# Patient Record
Sex: Male | Born: 2000 | Race: Black or African American | Hispanic: No | Marital: Single | Smoking: Never smoker
Health system: Southern US, Community
[De-identification: ages and names within clinical notes are randomized; demographics above are authoritative.]

---
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
## 2020-09-14 ENCOUNTER — Other Ambulatory Visit: Payer: Self-pay

## 2020-09-14 ENCOUNTER — Encounter (HOSPITAL_COMMUNITY): Payer: Self-pay

## 2020-09-14 ENCOUNTER — Ambulatory Visit (INDEPENDENT_AMBULATORY_CARE_PROVIDER_SITE_OTHER): Payer: Medicaid Other

## 2020-09-14 ENCOUNTER — Ambulatory Visit (HOSPITAL_COMMUNITY)
Admission: EM | Admit: 2020-09-14 | Discharge: 2020-09-14 | Disposition: A | Discharge status: Home / Self Care | Payer: Medicaid Other | Attending: Medical Oncology | Admitting: Medical Oncology

## 2020-09-14 DIAGNOSIS — S6991XA Unspecified injury of right wrist, hand and finger(s), initial encounter: Secondary | ICD-10-CM

## 2020-09-14 DIAGNOSIS — M79644 Pain in right finger(s): Secondary | ICD-10-CM | POA: Diagnosis not present

## 2020-09-14 MED ORDER — KETOROLAC TROMETHAMINE 30 MG/ML IJ SOLN
30.0000 mg | Freq: Once | INTRAMUSCULAR | Status: AC
  Administered 2020-09-14: 30 mg via INTRAMUSCULAR

## 2020-09-14 MED ORDER — KETOROLAC TROMETHAMINE 30 MG/ML IJ SOLN
INTRAMUSCULAR | Status: AC
  Filled 2020-09-14: qty 1

## 2020-09-14 MED ORDER — IBUPROFEN 800 MG PO TABS: 800.0000 mg | ORAL_TABLET | Freq: Three times a day (TID) | ORAL | 0 refills | Status: AC

## 2020-09-14 NOTE — ED Provider Notes (Signed)
[facility]    CSN: [id number] Arrival date & time: 09/14/20  1155      History   Chief Complaint Chief Complaint  Patient presents with  . Hand Pain    HPI Jorge Myers is a 20 y.o. male.   HPI   Hand Pain: Pt reports that yesterday he hit his hand against a wall. Immediately had thumb and right middle finger pain. Pain rated 8-9/10. He has not taken anything for pain today. He does reports some mild swelling of the thumb and finger along with mild numbness and tingling. No skin breakdown, erythema, loss of sensation or skin color change of fingers. No previous injury to this area.    History reviewed. No pertinent past medical history.  There are no problems to display for this patient.   History reviewed. No pertinent surgical history.     Home Medications    Prior to Admission medications   Medication Sig Start Date End Date Taking? Authorizing Provider  ibuprofen (ADVIL) 800 MG tablet Take 1 tablet (800 mg total) by mouth 3 (three) times daily. 09/15/20  Yes Rushie Chestnut, PA-C    Family History History reviewed. No pertinent family history.  Social History Social History   Tobacco Use  . Smoking status: Passive Smoke Exposure - Never Smoker  . Smokeless tobacco: Never Used  Substance Use Topics  . Alcohol use: No  . Drug use: No     Allergies   Patient has no known allergies.   Review of Systems Review of Systems  As stated above in HPI Physical Exam Triage Vital Signs ED Triage Vitals  Enc Vitals Group     BP 09/14/20 1213 131/71     Pulse Rate 09/14/20 1213 88     Resp 09/14/20 1213 16     Temp 09/14/20 1213 98.8 F (37.1 C)     Temp Source 09/14/20 1213 Oral     SpO2 09/14/20 1213 97 %     Weight --      Height --      Head Circumference --      Peak Flow --      Pain Score 09/14/20 1211 5     Pain Loc --      Pain Edu? --      Excl. in GC? --    No data found.  Updated Vital Signs BP 131/71 (BP Location:  Right Arm)   Pulse 88   Temp 98.8 F (37.1 C) (Oral)   Resp 16   SpO2 97%    Physical Exam Vitals and nursing note reviewed.  Constitutional:      General: He is not in acute distress.    Appearance: Normal appearance. He is not ill-appearing, toxic-appearing or diaphoretic.  Musculoskeletal:     Right forearm: Normal.     Left forearm: Normal.     Right wrist: Normal.     Left wrist: Normal.     Left hand: Normal.       Arms:  Neurological:     Mental Status: He is alert.      UC Treatments / Results  Labs (all labs ordered are listed, but only abnormal results are displayed) Labs Reviewed - No data to display  EKG   Radiology DG Hand Complete Right  Result Date: 09/14/2020 CLINICAL DATA:  Injury with pain in the thumb and long finger. Trauma yesterday. EXAM: RIGHT HAND - COMPLETE 3+ VIEW COMPARISON:  None. FINDINGS: There is  no evidence of fracture or dislocation. There is no evidence of arthropathy or other focal bone abnormality. Soft tissues are unremarkable. IMPRESSION: Normal Electronically Signed   By: Paulina Fusi M.D.   On: 09/14/2020 14:00    Procedures Procedures (including critical care time)  Medications Ordered in UC Medications  ketorolac (TORADOL) 30 MG/ML injection 30 mg (has no administration in time range)    Initial Impression / Assessment and Plan / UC Course  I have reviewed the triage vital signs and the nursing notes.  Pertinent labs & imaging results that were available during my care of the patient were reviewed by me and considered in my medical decision making (see chart for details).     New. X ray pending.   Update: X ray is normal.  Discussed with patient that he likely sprained these areas.  I do not want to brace these areas as I do not want the joints to stiffen up.  I have advised that he avoid any heavy lifting or twisting items such as opening a can of anything.  He has a driver today.  He has tolerated ketorolac well in  the past.  He will use Tylenol as needed and directed on the bottle for the rest of the day and then will use Aleve or ibuprofen as needed tomorrow.  Final Clinical Impressions(s) / UC Diagnoses   Final diagnoses:  Pain of right thumb  Finger pain, right   Discharge Instructions   None    ED Prescriptions    Medication Sig Dispense Auth. Provider   ibuprofen (ADVIL) 800 MG tablet Take 1 tablet (800 mg total) by mouth 3 (three) times daily. 21 tablet Rushie Chestnut, [place]     PDMP not reviewed this encounter.   Rushie Chestnut, [place] 09/14/20 1411

## 2020-09-14 NOTE — ED Triage Notes (Signed)
Pt presents with numbness and swelling in the right thumb x 1 day. States he hit a wall with th right hand.

## 2020-12-15 ENCOUNTER — Ambulatory Visit (HOSPITAL_COMMUNITY)
Admission: EM | Admit: 2020-12-15 | Discharge: 2020-12-15 | Disposition: A | Discharge status: Home / Self Care | Payer: Medicaid Other

## 2020-12-15 ENCOUNTER — Other Ambulatory Visit: Payer: Self-pay

## 2020-12-15 NOTE — ED Notes (Signed)
No answer

## 2020-12-15 NOTE — ED Triage Notes (Signed)
Pt called in front lobby and greeter called Pt on phone no answer

## 2021-05-18 ENCOUNTER — Ambulatory Visit (HOSPITAL_COMMUNITY)
Admission: EM | Admit: 2021-05-18 | Discharge: 2021-05-18 | Disposition: A | Discharge status: Home / Self Care | Payer: Medicaid Other

## 2021-05-18 ENCOUNTER — Encounter (HOSPITAL_COMMUNITY): Payer: Self-pay

## 2021-05-18 ENCOUNTER — Other Ambulatory Visit: Payer: Self-pay

## 2021-05-18 DIAGNOSIS — J029 Acute pharyngitis, unspecified: Secondary | ICD-10-CM | POA: Diagnosis not present

## 2021-05-18 NOTE — ED Provider Notes (Signed)
[facility]    CSN: [id number] Arrival date & time: 05/18/21  1018      History   Chief Complaint Chief Complaint  Patient presents with   Sore Throat         HPI Jorge Myers is a 20 y.o. male.   Patient presents today with a 2-day history of sore throat.  Reports that sore throat has improved but he continues to have some mild symptoms.  He denies any additional symptoms including cough, nasal congestion, shortness of breath, chest pain, nausea, vomiting.  Denies any known sick contacts.  He was exposed to more allergens that is with employment and wonders if this could have been contributing to symptoms.  He denies formal diagnosis of seasonal allergies does not take medication for this on a regular basis.  He has tried Tylenol and over-the-counter medications with improvement of symptoms.  Denies any difficulty speaking, difficulty swallowing, shortness of breath.  He did miss work for 2 days as result of symptoms and is requesting a work excuse note.   History reviewed. No pertinent past medical history.  There are no problems to display for this patient.   History reviewed. No pertinent surgical history.     Home Medications    Prior to Admission medications   Medication Sig Start Date End Date Taking? Authorizing Provider  ibuprofen (ADVIL) 800 MG tablet Take 1 tablet (800 mg total) by mouth 3 (three) times daily. 09/15/20   Rushie Chestnut, PA-C    Family History History reviewed. No pertinent family history.  Social History Social History   Tobacco Use   Smoking status: Never    Passive exposure: Yes   Smokeless tobacco: Never  Substance Use Topics   Alcohol use: Never   Drug use: Never     Allergies   Patient has no known allergies.   Review of Systems Review of Systems  Constitutional:  Negative for activity change, appetite change, fatigue and fever.  HENT:  Positive for sore throat. Negative for congestion, sinus pressure,  sneezing, trouble swallowing and voice change.   Respiratory:  Negative for cough and shortness of breath.   Cardiovascular:  Negative for chest pain.  Gastrointestinal:  Negative for abdominal pain, diarrhea, nausea and vomiting.  Musculoskeletal:  Negative for arthralgias and myalgias.  Neurological:  Negative for dizziness, light-headedness and headaches.    Physical Exam Triage Vital Signs ED Triage Vitals  Enc Vitals Group     BP 05/18/21 1135 124/72     Pulse Rate 05/18/21 1135 60     Resp 05/18/21 1135 16     Temp 05/18/21 1135 98.8 F (37.1 C)     Temp Source 05/18/21 1135 Oral     SpO2 05/18/21 1135 100 %     Weight --      Height --      Head Circumference --      Peak Flow --      Pain Score 05/18/21 1132 1     Pain Loc --      Pain Edu? --      Excl. in GC? --    No data found.  Updated Vital Signs BP 124/72 (BP Location: Right Arm)   Pulse 60   Temp 98.8 F (37.1 C) (Oral)   Resp 16   SpO2 100%   Visual Acuity Right Eye Distance:   Left Eye Distance:   Bilateral Distance:    Right Eye Near:   Left Eye  Near:    Bilateral Near:     Physical Exam Vitals reviewed.  Constitutional:      General: He is awake.     Appearance: Normal appearance. He is well-developed. He is not ill-appearing.     Comments: Very pleasant male appears stated age no acute distress sitting comfortably in exam room  HENT:     Head: Normocephalic and atraumatic.     Right Ear: Tympanic membrane, ear canal and external ear normal. Tympanic membrane is not erythematous or bulging.     Left Ear: Tympanic membrane, ear canal and external ear normal. Tympanic membrane is not erythematous or bulging.     Nose: Nose normal.     Mouth/Throat:     Pharynx: Uvula midline. No oropharyngeal exudate or posterior oropharyngeal erythema.     Comments: Mild drainage in posterior oropharynx Cardiovascular:     Rate and Rhythm: Normal rate and regular rhythm.     Heart sounds: Normal heart  sounds, S1 normal and S2 normal. No murmur heard. Pulmonary:     Effort: Pulmonary effort is normal. No accessory muscle usage or respiratory distress.     Breath sounds: Normal breath sounds. No stridor. No wheezing, rhonchi or rales.     Comments: Clear to auscultation bilaterally Neurological:     Mental Status: He is alert.  Psychiatric:        Behavior: Behavior is cooperative.     UC Treatments / Results  Labs (all labs ordered are listed, but only abnormal results are displayed) Labs Reviewed - No data to display  EKG   Radiology No results found.  Procedures Procedures (including critical care time)  Medications Ordered in UC Medications - No data to display  Initial Impression / Assessment and Plan / UC Course  I have reviewed the triage vital signs and the nursing notes.  Pertinent labs & imaging results that were available during my care of the patient were reviewed by me and considered in my medical decision making (see chart for details).     Patient without improvement of symptoms and has no clinical features of mono or strep so additional testing was deferred.  Suspect allergies versus viral pharyngitis as etiology of symptoms.  Patient was encouraged to use over-the-counter medications for symptom management including Tylenol and ibuprofen.  He is to gargle with warm salt water.  Discussed that if symptoms recur or worsen he should try an antihistamine daily as allergies could be contributing to symptoms via postnasal drip.  Discussed alarm symptoms that warrant emergent evaluation.  Work excuse note provided as requested.  Strict return precautions given to which he expressed understanding.  Final Clinical Impressions(s) / UC Diagnoses   Final diagnoses:  Viral pharyngitis  Sore throat     Discharge Instructions      If your symptoms return and I do recommend starting an antihistamine.  Gargle with warm salt water.  If you have any sudden worsening of  symptoms including fever, difficulty swallowing, shortness of breath, swelling of your throat or mouth you need to go to the emergency room.     ED Prescriptions   None    PDMP not reviewed this encounter.   Jeani Hawking, PA-C 05/18/21 1212

## 2021-05-18 NOTE — ED Triage Notes (Signed)
Pt reports sore throat x 2 days. Denies fever.

## 2021-05-18 NOTE — Discharge Instructions (Signed)
If your symptoms return and I do recommend starting an antihistamine.  Gargle with warm salt water.  If you have any sudden worsening of symptoms including fever, difficulty swallowing, shortness of breath, swelling of your throat or mouth you need to go to the emergency room.

## 2022-06-16 IMAGING — DX DG HAND COMPLETE 3+V*R*
3 series · 3 of 3 positions shown · non-contrast
Comparison: None.

CLINICAL DATA: Injury with pain in the thumb and long finger.
Trauma yesterday.

EXAM:
RIGHT HAND - COMPLETE 3+ VIEW

[hand pa]
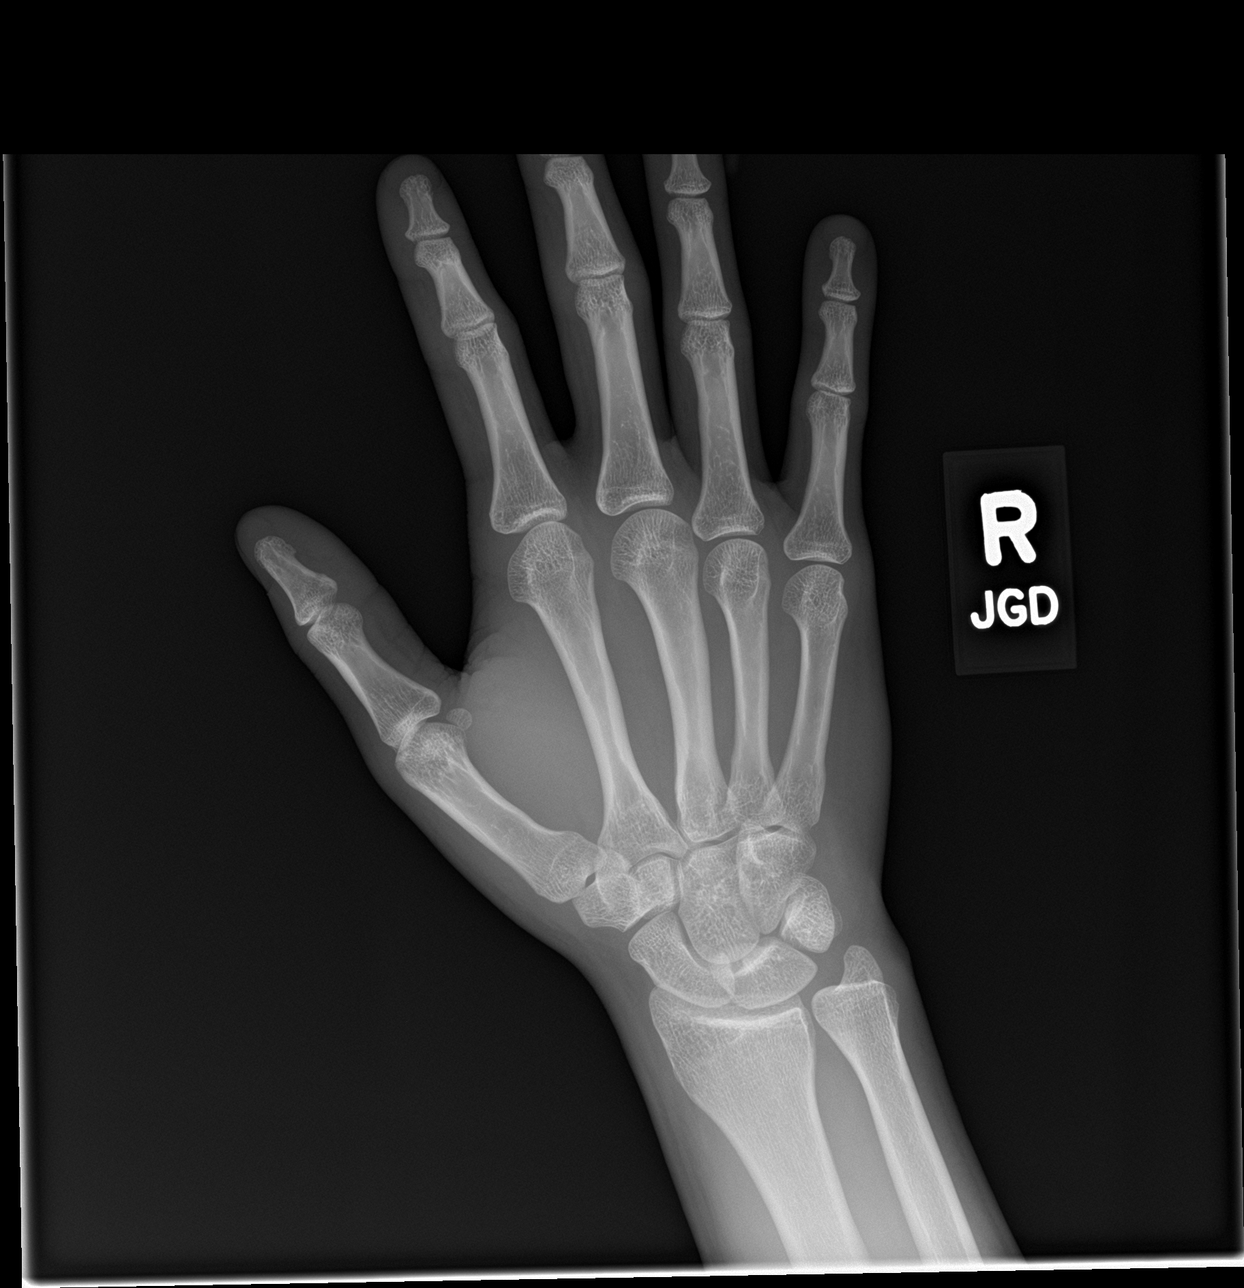

[hand obl]
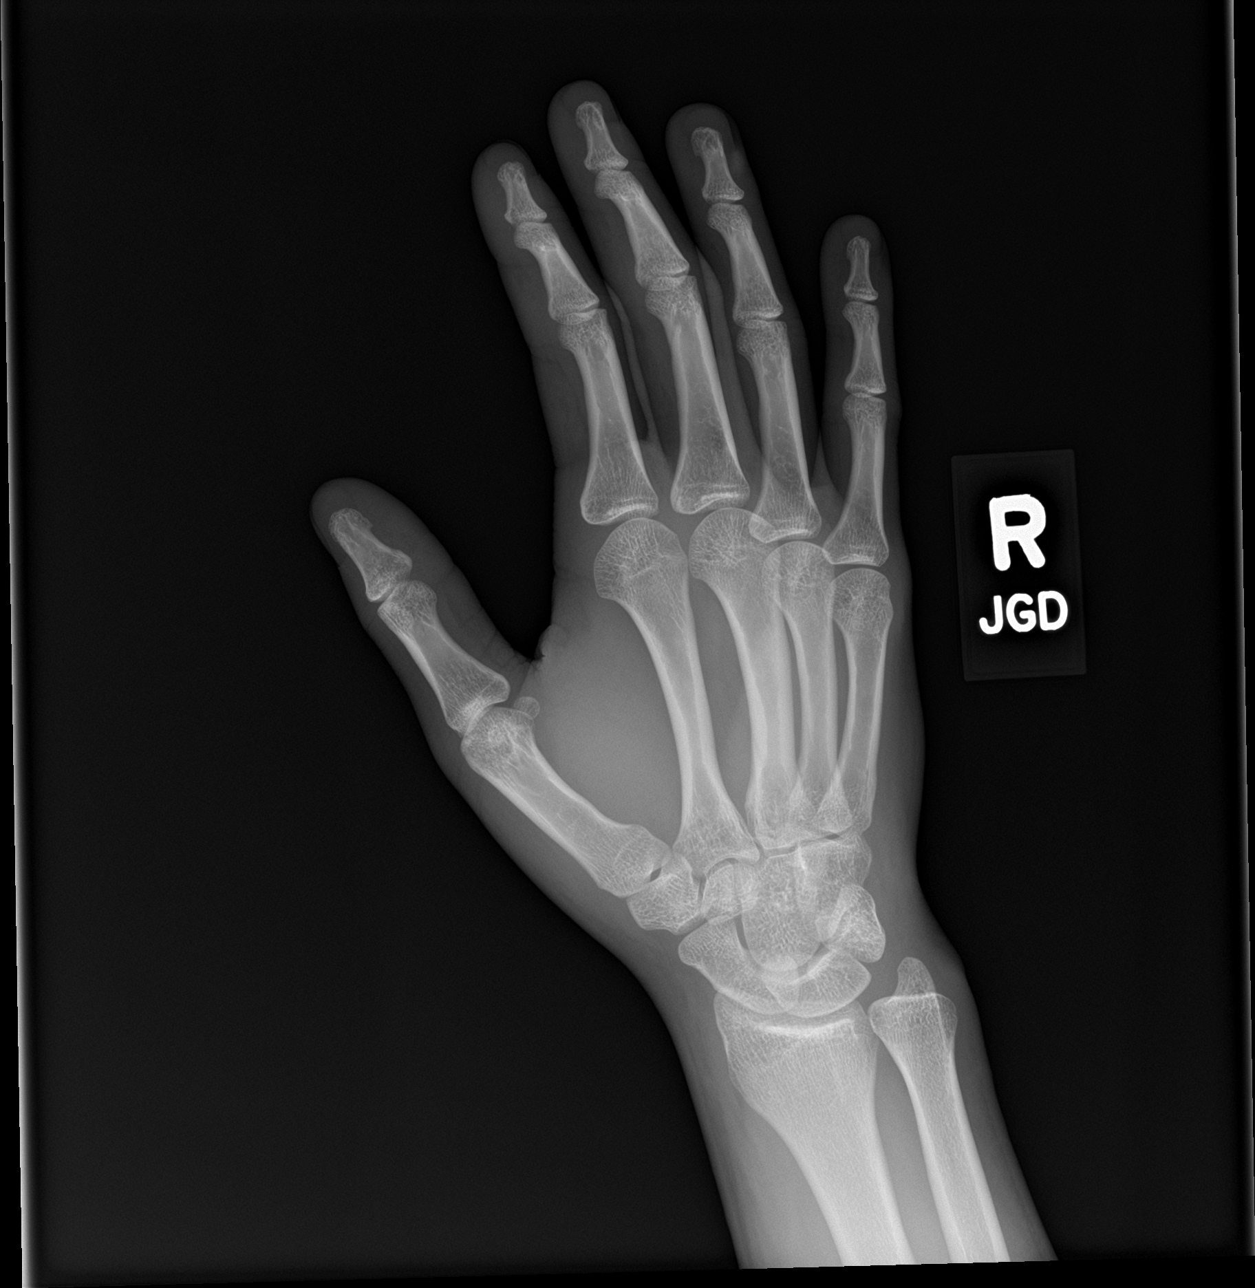

[hand lat]
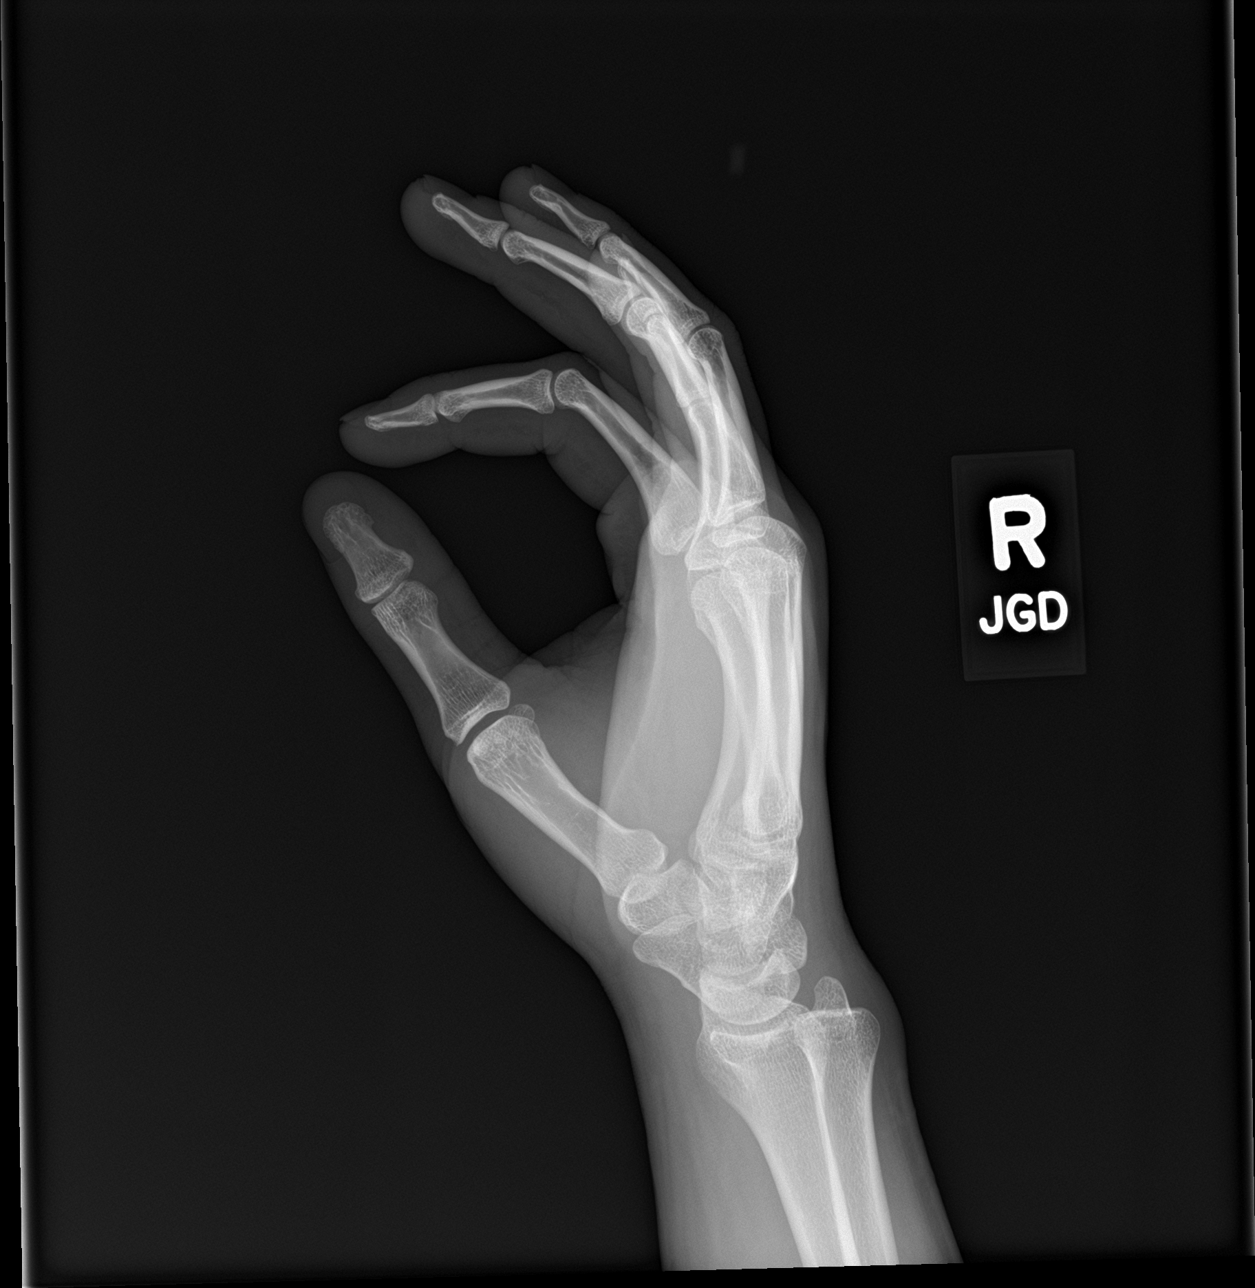

[3 of 3 positions shown; findings below may reference images not displayed]

FINDINGS: There is no evidence of fracture or dislocation. There is no
evidence of arthropathy or other focal bone abnormality. Soft
tissues are unremarkable.
IMPRESSION: Normal
